# Patient Record
Sex: Female | Born: 1975 | Smoking: Never smoker
Health system: Southern US, Community
[De-identification: ages and names within clinical notes are randomized; demographics above are authoritative.]

## PROBLEM LIST (undated history)

## (undated) DIAGNOSIS — O24419 Gestational diabetes mellitus in pregnancy, unspecified control: Secondary | ICD-10-CM

## (undated) HISTORY — PX: NO PAST SURGERIES: SHX2092

---

## 2015-01-25 NOTE — L&D Delivery Note (Signed)
Delivery Note At 11:08 PM a viable female was delivered via Vaginal, Spontaneous Delivery (Presentation: OA).  APGAR: 9, 9; weight 6 lb 3.3 oz (2815 g).   Placenta status: Intact, Shultz .  3V Cord:  Anesthesia:  None Episiotomy: None Lacerations: None Est. Blood Loss (mL): 50  Mom to postpartum.  Baby to Couplet care / Skin to Skin.  [name] 09/24/2015, 1:00 AM  Patient is a [id number] at 5480w5d who was admitted in latent labor, significant hx of A2GDM.  She progressed with augmentation via AROM/Pit.  I was gloved and present for delivery in its entirety.  Second stage of labor progressed to SVD.  Mild decels during second stage noted.  Complications: none  Lacerations: none  EBL: 50cc  [name], CNM 9:18 AM  09/24/2015

## 2015-08-12 ENCOUNTER — Ambulatory Visit (INDEPENDENT_AMBULATORY_CARE_PROVIDER_SITE_OTHER): Payer: Self-pay | Admitting: Obstetrics & Gynecology

## 2015-08-12 ENCOUNTER — Encounter: Payer: Self-pay | Admitting: Obstetrics & Gynecology

## 2015-08-12 VITALS — BP 117/77 | HR 98 | Ht 62.0 in | Wt 153.0 lb

## 2015-08-12 DIAGNOSIS — O0930 Supervision of pregnancy with insufficient antenatal care, unspecified trimester: Secondary | ICD-10-CM | POA: Insufficient documentation

## 2015-08-12 DIAGNOSIS — O09529 Supervision of elderly multigravida, unspecified trimester: Secondary | ICD-10-CM | POA: Insufficient documentation

## 2015-08-12 DIAGNOSIS — Z603 Acculturation difficulty: Secondary | ICD-10-CM | POA: Insufficient documentation

## 2015-08-12 DIAGNOSIS — Z124 Encounter for screening for malignant neoplasm of cervix: Secondary | ICD-10-CM

## 2015-08-12 DIAGNOSIS — Z641 Problems related to multiparity: Secondary | ICD-10-CM

## 2015-08-12 DIAGNOSIS — O09523 Supervision of elderly multigravida, third trimester: Secondary | ICD-10-CM

## 2015-08-12 DIAGNOSIS — Z3483 Encounter for supervision of other normal pregnancy, third trimester: Secondary | ICD-10-CM

## 2015-08-12 DIAGNOSIS — O0933 Supervision of pregnancy with insufficient antenatal care, third trimester: Secondary | ICD-10-CM

## 2015-08-12 DIAGNOSIS — Z1151 Encounter for screening for human papillomavirus (HPV): Secondary | ICD-10-CM

## 2015-08-12 DIAGNOSIS — Z113 Encounter for screening for infections with a predominantly sexual mode of transmission: Secondary | ICD-10-CM

## 2015-08-12 DIAGNOSIS — Z789 Other specified health status: Secondary | ICD-10-CM | POA: Insufficient documentation

## 2015-08-12 DIAGNOSIS — O0990 Supervision of high risk pregnancy, unspecified, unspecified trimester: Secondary | ICD-10-CM | POA: Insufficient documentation

## 2015-08-12 LAB — POCT URINALYSIS DIP (DEVICE)
Bilirubin Urine: NEGATIVE
Glucose, UA: 500 mg/dL — AB
Ketones, ur: NEGATIVE mg/dL
LEUKOCYTES UA: NEGATIVE
NITRITE: NEGATIVE
PH: 6.5 (ref 5.0–8.0)
PROTEIN: NEGATIVE mg/dL
Specific Gravity, Urine: 1.015 (ref 1.005–1.030)
UROBILINOGEN UA: 0.2 mg/dL (ref 0.0–1.0)

## 2015-08-12 NOTE — Progress Notes (Signed)
Video interpreter [phone]#[id number]

## 2015-08-12 NOTE — Progress Notes (Signed)
   Subjective:    Carolyn Huffman is a [id number] 8433w5d being seen today for her first obstetrical visit.  Her obstetrical history is significant for advanced maternal age and language barrier, grand multipara. Patient does intend to breast feed. Pregnancy history fully reviewed.  Patient reports no complaints.  Filed Vitals:   08/12/15 1506 08/12/15 1506  BP: 117/77   Pulse: 98   Height:  5\' 2"  (1.575 m)  Weight: 153 lb (69.4 kg)     HISTORY: OB History  Gravida Para Term Preterm AB SAB TAB Ectopic Multiple Living  8 5 5  0 2 2 0 0 0 5    # Outcome Date GA Lbr Len/2nd Weight Sex Delivery Anes PTL Lv  8 Current           7 Term 06/04/10 7066w0d  8 lb 2.5 oz (3.7 kg) M Jarrett AblesVag-Spont  N Y  6 Term 07/18/08 642w0d  7 lb 0.9 oz (3.2 kg) M Jarrett AblesVag-Spont  N Y  5 Term 08/07/04 9242w0d  7 lb 0.9 oz (3.2 kg) M Jarrett AblesVag-Spont  N Y  4 Term 06/01/03 3042w0d  6 lb 6.3 oz (2.9 kg) M Vag-Spont  N Y  3 Term 08/30/01 6766w0d  6 lb 2.8 oz (2.8 kg) F Vag-Spont  N Y  2 SAB           1 SAB              History reviewed. No pertinent past medical history. Past Surgical History  Procedure Laterality Date  . No past surgeries     Family History  Problem Relation Age of Onset  . Hypertension Mother   . Hypertension Father      Exam    Uterus:     Pelvic Exam:    Perineum: No Hemorrhoids   Vulva: normal   Vagina:  normal mucosa   pH:    Cervix: anteverted   Adnexa: normal adnexa   Bony Pelvis: android  System: Breast:  normal appearance, no masses or tenderness   Skin: normal coloration and turgor, no rashes    Neurologic: oriented   Extremities: normal strength, tone, and muscle mass   HEENT PERRLA   Mouth/Teeth mucous membranes moist, pharynx normal without lesions   Neck supple   Cardiovascular: regular rate and rhythm   Respiratory:  appears well, vitals normal, no respiratory distress, acyanotic, normal RR, ear and throat exam is normal, neck free of mass or lymphadenopathy, chest clear, no  wheezing, crepitations, rhonchi, normal symmetric air entry   Abdomen: soft, non-tender; bowel sounds normal; no masses,  no organomegaly   Urinary: urethral meatus normal      Assessment:    Pregnancy: [id number] Patient Active Problem List   Diagnosis Date Noted  . AMA (advanced maternal age) multigravida 35+ 08/12/2015  . Language barrier 08/12/2015  . Late prenatal care 08/12/2015  . Grand multipara 08/12/2015  . Supervision of other normal pregnancy, antepartum 08/12/2015        Plan:     Initial labs drawn. Prenatal vitamins. Problem list reviewed and updated.   Ultrasound discussed; fetal survey: ordered.  Follow up in 2 weeks.  Allie BossierMyra C Majesta Leichter 08/12/2015

## 2015-08-13 LAB — PAIN MGMT, PROFILE 6 CONF W/O MM, U
6 Acetylmorphine: NEGATIVE ng/mL (ref <10)
AMPHETAMINES: NEGATIVE ng/mL (ref <500)
Alcohol Metabolites: NEGATIVE ng/mL (ref <500)
BARBITURATES: NEGATIVE ng/mL (ref <300)
BENZODIAZEPINES: NEGATIVE ng/mL (ref <100)
COCAINE METABOLITE: NEGATIVE ng/mL (ref <150)
CREATININE: 91.2 mg/dL (ref >=20.0)
MARIJUANA METABOLITE: NEGATIVE ng/mL (ref <20)
Methadone Metabolite: NEGATIVE ng/mL (ref <100)
OPIATES: NEGATIVE ng/mL (ref <100)
OXIDANT: NEGATIVE ug/mL (ref <200)
OXYCODONE: NEGATIVE ng/mL (ref <100)
PLEASE NOTE: 0
Phencyclidine: NEGATIVE ng/mL (ref <25)
pH: 6.9 (ref 4.5–9.0)

## 2015-08-13 LAB — GLUCOSE TOLERANCE, 1 HOUR (50G) W/O FASTING: Glucose, 1 Hr, gestational: 266 mg/dL — ABNORMAL HIGH (ref <140)

## 2015-08-13 LAB — PRENATAL PROFILE (SOLSTAS)
ANTIBODY SCREEN: NEGATIVE
BASOS PCT: 0 %
Basophils Absolute: 0 cells/uL (ref 0–200)
Eosinophils Absolute: 63 cells/uL (ref 15–500)
Eosinophils Relative: 1 %
HEMATOCRIT: 29 % — AB (ref 35.0–45.0)
HIV 1&2 Ab, 4th Generation: NONREACTIVE
Hemoglobin: 9.8 g/dL — ABNORMAL LOW (ref 11.7–15.5)
Hepatitis B Surface Ag: NEGATIVE
LYMPHS PCT: 25 %
Lymphs Abs: 1575 cells/uL (ref 850–3900)
MCH: 30.6 pg (ref 27.0–33.0)
MCHC: 33.8 g/dL (ref 32.0–36.0)
MCV: 90.6 fL (ref 80.0–100.0)
MONOS PCT: 9 %
MPV: 11.2 fL (ref 7.5–12.5)
Monocytes Absolute: 567 cells/uL (ref 200–950)
Neutro Abs: 4095 cells/uL (ref 1500–7800)
Neutrophils Relative %: 65 %
PLATELETS: 191 10*3/uL (ref 140–400)
RBC: 3.2 MIL/uL — AB (ref 3.80–5.10)
RDW: 13.5 % (ref 11.0–15.0)
RH TYPE: POSITIVE
Rubella: 1.68 Index — ABNORMAL HIGH (ref <0.90)
WBC: 6.3 10*3/uL (ref 3.8–10.8)

## 2015-08-13 LAB — GC/CHLAMYDIA PROBE AMP (~~LOC~~) NOT AT ARMC
Chlamydia: NEGATIVE
NEISSERIA GONORRHEA: NEGATIVE

## 2015-08-14 ENCOUNTER — Encounter (HOSPITAL_COMMUNITY): Payer: Self-pay | Admitting: Obstetrics & Gynecology

## 2015-08-14 LAB — HEMOGLOBINOPATHY EVALUATION
HCT: 29 % — ABNORMAL LOW (ref 35.0–45.0)
HGB A2 QUANT: 2.3 % (ref 1.8–3.5)
HGB A: 96.7 % (ref >96.0)
Hemoglobin: 9.8 g/dL — ABNORMAL LOW (ref 11.7–15.5)
MCH: 30.6 pg (ref 27.0–33.0)
MCV: 90.6 fL (ref 80.0–100.0)
RBC: 3.2 MIL/uL — ABNORMAL LOW (ref 3.80–5.10)
RDW: 13.5 % (ref 11.0–15.0)

## 2015-08-14 LAB — CULTURE, OB URINE
Colony Count: NO GROWTH
Organism ID, Bacteria: NO GROWTH

## 2015-08-14 LAB — CYTOLOGY - PAP

## 2015-08-21 ENCOUNTER — Encounter (HOSPITAL_COMMUNITY): Payer: Self-pay

## 2015-08-21 ENCOUNTER — Ambulatory Visit (HOSPITAL_COMMUNITY)
Admission: RE | Admit: 2015-08-21 | Discharge: 2015-08-21 | Disposition: A | Discharge status: Home / Self Care | Payer: Self-pay | Source: Ambulatory Visit | Attending: Obstetrics & Gynecology | Admitting: Obstetrics & Gynecology

## 2015-08-21 ENCOUNTER — Other Ambulatory Visit: Payer: Self-pay | Admitting: Obstetrics & Gynecology

## 2015-08-21 DIAGNOSIS — Z641 Problems related to multiparity: Secondary | ICD-10-CM

## 2015-08-21 DIAGNOSIS — Z3689 Encounter for other specified antenatal screening: Secondary | ICD-10-CM

## 2015-08-21 DIAGNOSIS — O0933 Supervision of pregnancy with insufficient antenatal care, third trimester: Secondary | ICD-10-CM

## 2015-08-21 DIAGNOSIS — Z3A34 34 weeks gestation of pregnancy: Secondary | ICD-10-CM | POA: Insufficient documentation

## 2015-08-21 DIAGNOSIS — Z3483 Encounter for supervision of other normal pregnancy, third trimester: Secondary | ICD-10-CM

## 2015-08-21 DIAGNOSIS — O09523 Supervision of elderly multigravida, third trimester: Secondary | ICD-10-CM

## 2015-08-21 DIAGNOSIS — O0943 Supervision of pregnancy with grand multiparity, third trimester: Secondary | ICD-10-CM

## 2015-08-21 DIAGNOSIS — Z789 Other specified health status: Secondary | ICD-10-CM

## 2015-08-21 DIAGNOSIS — Z36 Encounter for antenatal screening of mother: Secondary | ICD-10-CM | POA: Insufficient documentation

## 2015-08-21 IMAGING — US US MFM OB DETAIL+14 WK
1 series · 14 of 28 positions shown · non-contrast
Comparison: none

[Series 1: us mfm ob detail+14 wk · 82 acquisitions, 14 frames shown]
[im 4/82]
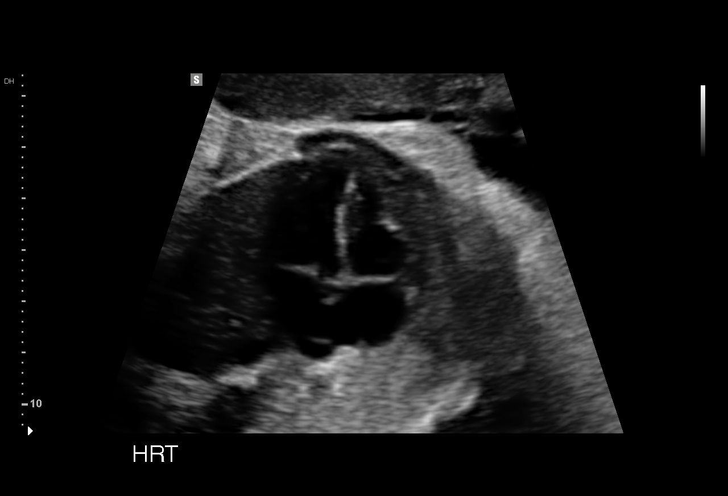
[im 10/82]
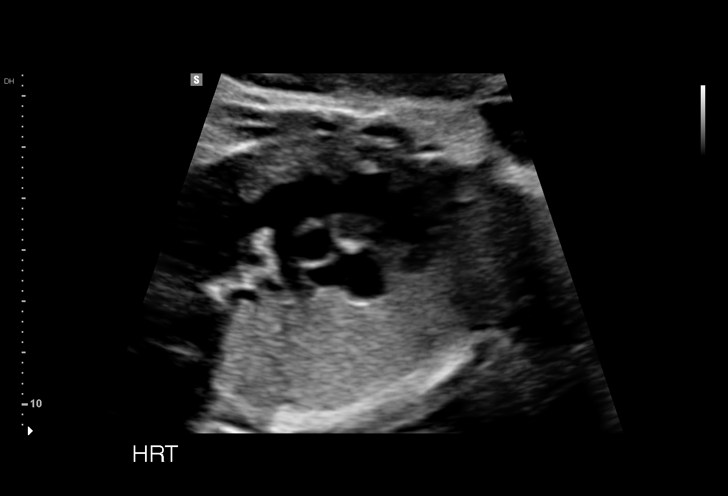
[im 16/82]
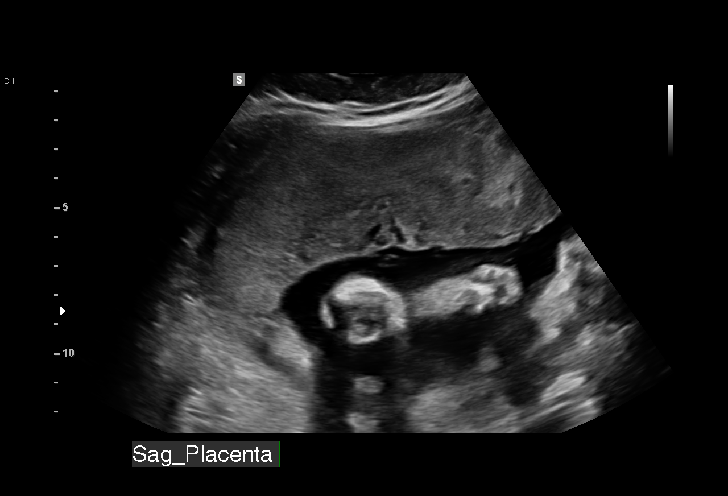
[im 22/82]
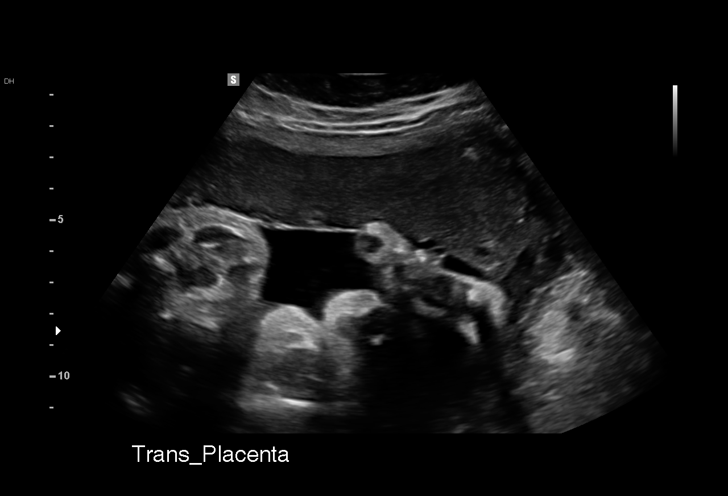
[im 28/82]
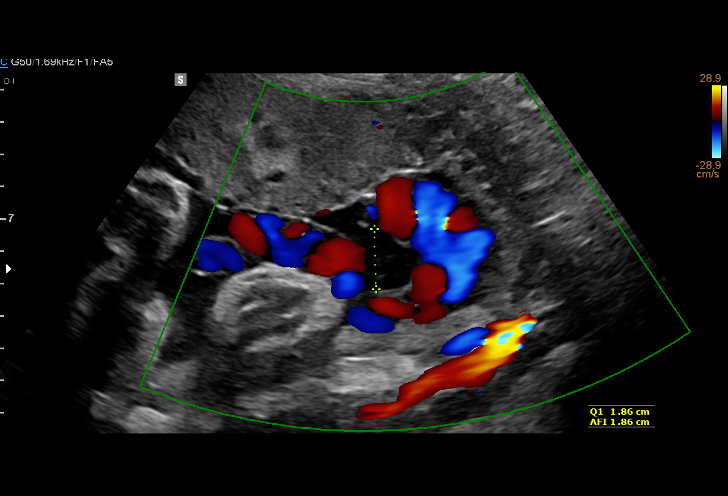
[im 34/82]
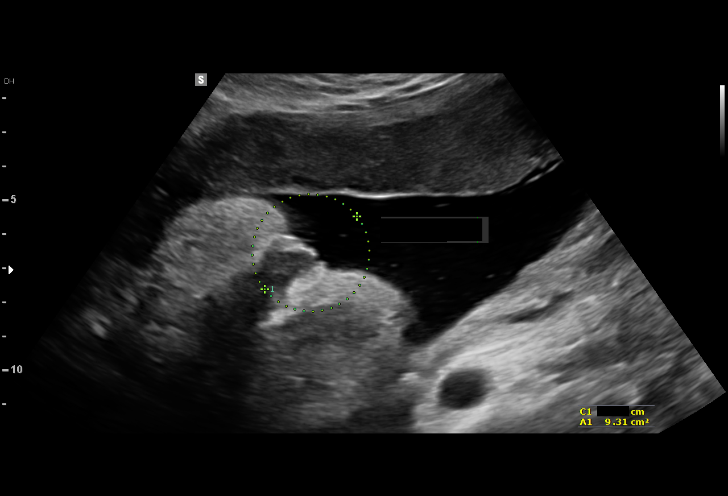
[im 40/82]
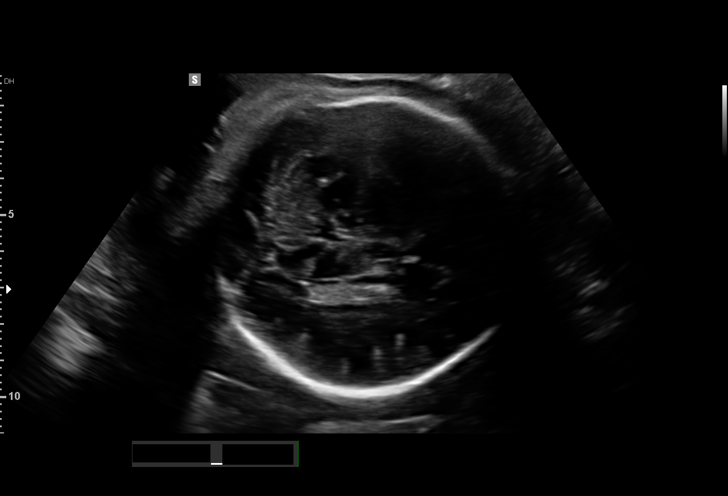
[im 46/82]
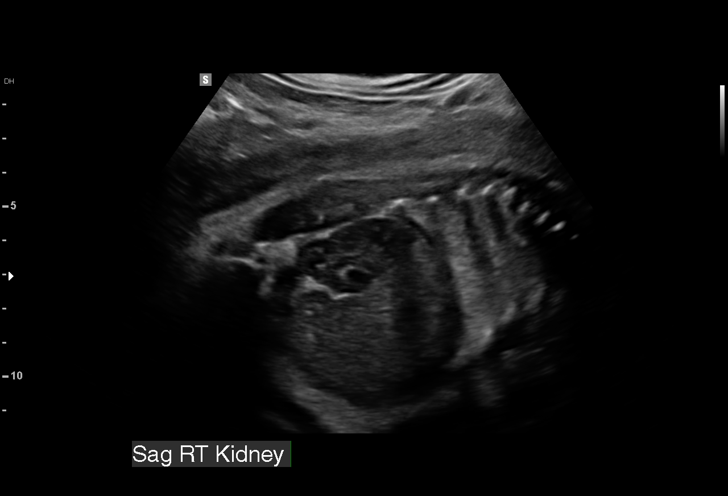
[im 52/82]
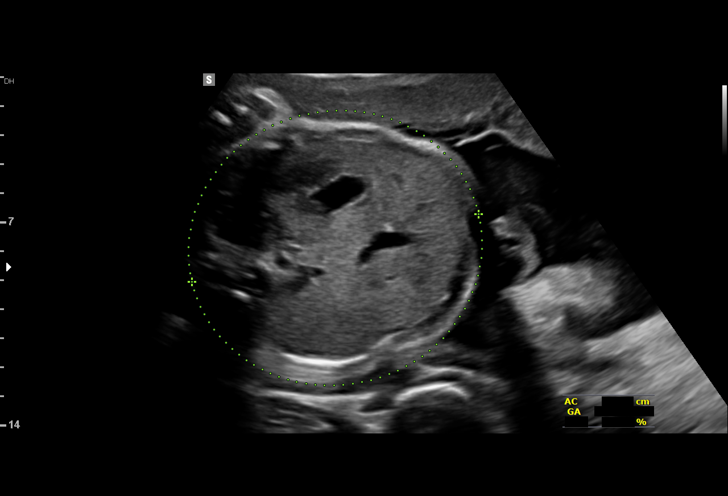
[im 58/82]
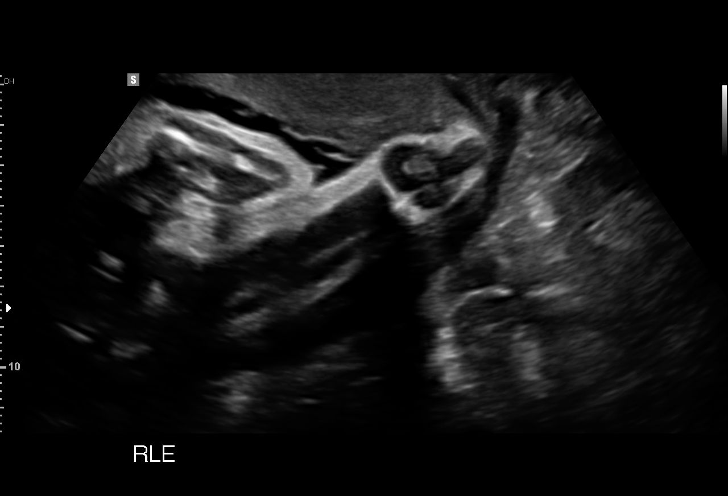
[im 64/82]
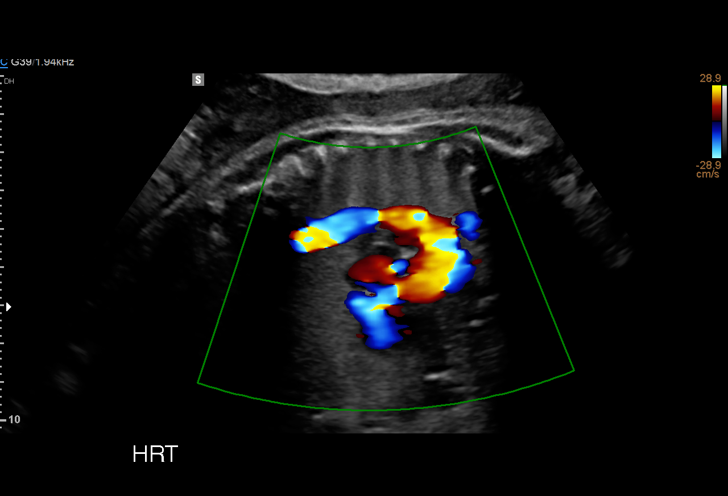
[im 70/82]
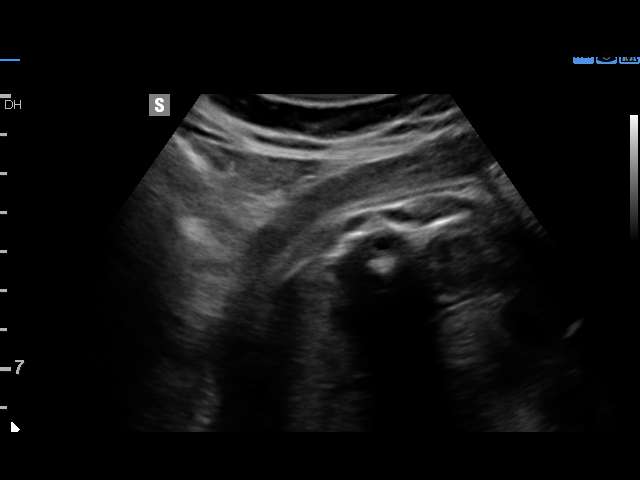
[im 76/82]
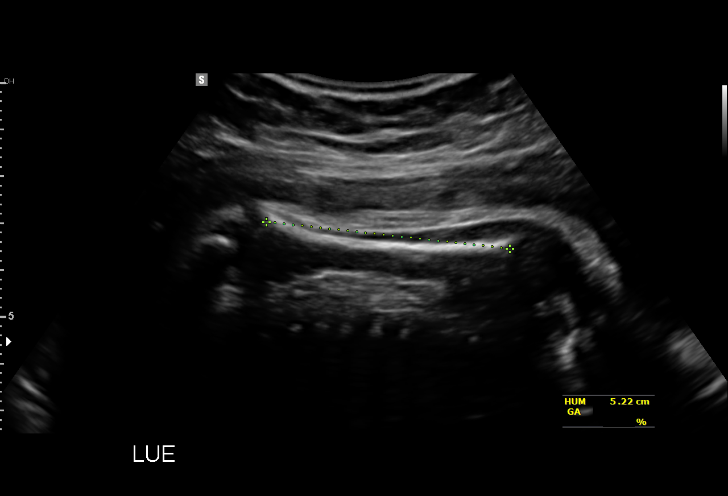
[im 82/82]
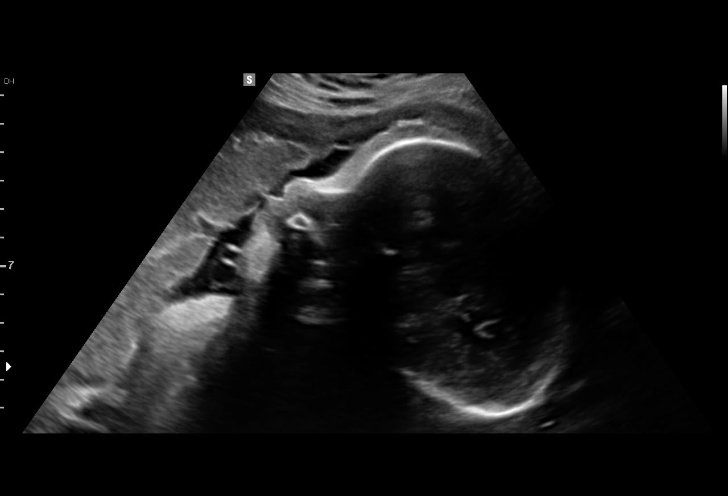

[14 of 28 positions shown; findings below may reference images not displayed]

[facility]
[REDACTED]-
Faculty Physician

1  KAAY                [PHONE_NUMBER]      [PHONE_NUMBER]     [PHONE_NUMBER]
Indications

34 weeks gestation of pregnancy
Detailed fetal anatomic survey                 Z36
Advanced maternal age multigravida 35+,        [C8]
third trimester
Grand multiparity, antepartum                  [C8]
Late to prenatal care, third trimester         [C8]
OB History

Gravidity:    8         Term:   5        Prem:   0        SAB:   2
TOP:          0       Ectopic:  0        Living: 5
Fetal Evaluation

Num Of Fetuses:     1
Fetal Heart         122
Rate(bpm):
Cardiac Activity:   Observed
Presentation:       Cephalic
Placenta:           Anterior, above cervical os
P. Cord Insertion:  Visualized, central

Amniotic Fluid
AFI FV:      Subjectively within normal limits

AFI Sum(cm)     %Tile       Largest Pocket(cm)
11.55           30
RUQ(cm)       RLQ(cm)       LUQ(cm)        LLQ(cm)
1.86
Biometry

BPD:        79  mm     G. Age:  31w 5d          3  %    CI:        72.08   %   70 - 86
FL/HC:      20.7   %   19.4 -
HC:      296.1  mm     G. Age:  32w 5d        < 3  %    HC/AC:      0.96       0.96 -
AC:       310   mm     G. Age:  34w 6d         79  %    FL/BPD:     77.7   %   71 - 87
FL:       61.4  mm     G. Age:  31w 6d          4  %    FL/AC:      19.8   %   20 - 24
HUM:      52.2  mm     G. Age:  30w 3d        < 5  %
CER:      43.6  mm     G. Age:  37w 4d         91  %

CM:        7.6  mm

Est. FW:    [C8]  gm    4 lb 14 oz      50  %
Gestational Age

LMP:           34w 0d       Date:   [DATE]                 EDD:   [DATE]
U/S Today:     32w 6d                                        EDD:   [DATE]
Best:          34w 0d    Det. By:   LMP  ([DATE])          EDD:   [DATE]
Anatomy

Cranium:               Appears normal         Aortic Arch:            Appears normal
Cavum:                 Appears normal         Ductal Arch:            Appears normal
Ventricles:            Appears normal         Diaphragm:              Appears normal
Choroid Plexus:        Appears normal         Stomach:                Appears normal, left
sided
Cerebellum:            Appears normal         Abdomen:                Appears normal
Posterior Fossa:       Appears normal         Abdominal Wall:         Not well visualized
Nuchal Fold:           Not applicable (>20    Cord Vessels:           Appears normal (3
wks GA)                                        vessel cord)
Face:                  Appears normal         Kidneys:                Appear normal
(orbits and profile)
Lips:                  Appears normal         Bladder:                Appears normal
Thoracic:              Appears normal         Spine:                  Appears normal
Heart:                 Appears normal         Upper Extremities:      Appears normal
(4CH, axis, and
situs)
RVOT:                  Appears normal         Lower Extremities:      Appears normal
LVOT:                  Appears normal

Other:  Fetus appears to be a female. Heels visualized. Nasal bone
visualized. Technically difficult due to advanced GA and fetal position.
Cervix Uterus Adnexa

Cervix
Not visualized (advanced GA >[C8])

Left Ovary
Within normal limits.

Right Ovary
Within normal limits.

Adnexa:       No abnormality visualized.
Impression

Single IUP at 34w 0d
Advanced maternal age > 40
Normal fetal anatomic survey, but somewhat limited due to
advanced gestational age
The estimated fetal weight is at the 50th %tile
Anterior placenta without previa
Normal amniotic fluid volume
Recommendations

Recommend antenatal testing beginning no later than 36
weeks gestation (AMA > 40)
Consider ultrasound for growth in 4 weeks if undelived
Recommend delivery by EDD

## 2015-08-25 ENCOUNTER — Telehealth: Payer: Self-pay | Admitting: *Deleted

## 2015-08-25 DIAGNOSIS — O24419 Gestational diabetes mellitus in pregnancy, unspecified control: Secondary | ICD-10-CM

## 2015-08-25 NOTE — Telephone Encounter (Signed)
Per Dr. Marice Potter, pt has Gestational Diabetes and needs appt w/Diabetes Educator. I scheduled appt on 8/3 @ 1530 w/ Maggie @ MFM dept. Pt was then called w/[employer] interpreter # [phone] and I left a message stating that she has appt in our office tomorrow @ 0940. This appt is very important as we have new test results to discuss with her. Pt will be advised of Diabetes Ed appt tomorrow during her prenatal visit.

## 2015-08-26 ENCOUNTER — Ambulatory Visit (INDEPENDENT_AMBULATORY_CARE_PROVIDER_SITE_OTHER): Payer: Self-pay | Admitting: Advanced Practice Midwife

## 2015-08-26 ENCOUNTER — Encounter: Payer: Self-pay | Admitting: Advanced Practice Midwife

## 2015-08-26 DIAGNOSIS — Z3483 Encounter for supervision of other normal pregnancy, third trimester: Secondary | ICD-10-CM

## 2015-08-26 DIAGNOSIS — O09529 Supervision of elderly multigravida, unspecified trimester: Secondary | ICD-10-CM | POA: Insufficient documentation

## 2015-08-26 DIAGNOSIS — O09523 Supervision of elderly multigravida, third trimester: Secondary | ICD-10-CM

## 2015-08-26 LAB — POCT URINALYSIS DIP (DEVICE)
BILIRUBIN URINE: NEGATIVE
Glucose, UA: >=1000 mg/dL — AB
Ketones, ur: NEGATIVE mg/dL
Leukocytes, UA: NEGATIVE
Nitrite: NEGATIVE
PH: 6 (ref 5.0–8.0)
PROTEIN: NEGATIVE mg/dL
SPECIFIC GRAVITY, URINE: 1.01 (ref 1.005–1.030)
UROBILINOGEN UA: 0.2 mg/dL (ref 0.0–1.0)

## 2015-08-26 NOTE — Patient Instructions (Signed)
Gestational Diabetes Mellitus  Gestational diabetes mellitus, often simply referred to as gestational diabetes, is a type of diabetes that some women develop during pregnancy. In gestational diabetes, the pancreas does not make enough insulin (a hormone), the cells are less responsive to the insulin that is made (insulin resistance), or both. Normally, insulin moves sugars from food into the tissue cells. The tissue cells use the sugars for energy. The lack of insulin or the lack of normal response to insulin causes excess sugars to build up in the blood instead of going into the tissue cells. As a result, high blood sugar (hyperglycemia) develops. The effect of high sugar (glucose) levels can cause many problems.   RISK FACTORS  You have an increased chance of developing gestational diabetes if you have a family history of diabetes and also have one or more of the following risk factors:  · A body mass index over 30 (obesity).  · A previous pregnancy with gestational diabetes.  · An older age at the time of pregnancy.  If blood glucose levels are kept in the normal range during pregnancy, women can have a healthy pregnancy. If your blood glucose levels are not well controlled, there may be risks to you, your unborn baby (fetus), your labor and delivery, or your newborn baby.   SYMPTOMS   If symptoms are experienced, they are much like symptoms you would normally expect during pregnancy. The symptoms of gestational diabetes include:   · Increased thirst (polydipsia).  · Increased urination (polyuria).  · Increased urination during the night (nocturia).  · Weight loss. This weight loss may be rapid.  · Frequent, recurring infections.  · Tiredness (fatigue).  · Weakness.  · Vision changes, such as blurred vision.  · Fruity smell to your breath.  · Abdominal pain.  DIAGNOSIS  Diabetes is diagnosed when blood glucose levels are increased. Your blood glucose level may be checked by one or more of the following blood  tests:  · A fasting blood glucose test. You will not be allowed to eat for at least 8 hours before a blood sample is taken.  · A random blood glucose test. Your blood glucose is checked at any time of the day regardless of when you ate.  · An oral glucose tolerance test (OGTT). Your blood glucose is measured after you have not eaten (fasted) for 1-3 hours and then after you drink a glucose-containing beverage. Since the hormones that cause insulin resistance are highest at about 24-28 weeks of a pregnancy, an OGTT is usually performed during that time. If you have risk factors, you may be screened for undiagnosed type 2 diabetes at your first prenatal visit.  TREATMENT   Gestational diabetes should be managed first with diet and exercise. Medicines may be added only if they are needed.  · You will need to take diabetes medicine or insulin daily to keep blood glucose levels in the desired range.  · You will need to match insulin dosing with exercise and healthy food choices.  If you have gestational diabetes, your treatment goal is to maintain the following blood glucose levels:  · Before meals (preprandial): at or below 95 mg/dL.  · After meals (postprandial):    One hour after a meal: at or below 140 mg/dL.    Two hours after a meal: at or below 120 mg/dL.  If you have pre-existing type 1 or type 2 diabetes, your treatment goal is to maintain the following blood glucose levels:  · Before   meals, at bedtime, and overnight: 60-99 mg/dL.  · After meals: peak of 100-129 mg/dL.  HOME CARE INSTRUCTIONS   · Have your hemoglobin A1c level checked twice a year.  · Perform daily blood glucose monitoring as directed by your health care provider. It is common to perform frequent blood glucose monitoring.  · Monitor urine ketones when you are ill and as directed by your health care provider.  · Take your diabetes medicine and insulin as directed by your health care provider to maintain your blood glucose level in the desired  range.  ¨ Never run out of diabetes medicine or insulin. It is needed every day.  ¨ Adjust insulin based on your intake of carbohydrates. Carbohydrates can raise blood glucose levels but need to be included in your diet. Carbohydrates provide vitamins, minerals, and fiber which are an essential part of a healthy diet. Carbohydrates are found in fruits, vegetables, whole grains, dairy products, legumes, and foods containing added sugars.  · Eat healthy foods. Alternate 3 meals with 3 snacks.  · Maintain a healthy weight gain. The usual total expected weight gain varies according to your prepregnancy body mass index (BMI).  · Carry a medical alert card or wear your medical alert jewelry.  · Carry a 15-gram carbohydrate snack with you at all times to treat low blood glucose (hypoglycemia). Some examples of 15-gram carbohydrate snacks include:  ¨ Glucose tablets, 3 or 4.  ¨ Glucose gel, 15-gram tube.  ¨ Raisins, 2 tablespoons (24 g).  ¨ Jelly beans, 6.  ¨ Animal crackers, 8.  ¨ Fruit juice, regular soda, or low-fat milk, 4 ounces (120 mL).  ¨ Gummy treats, 9.  · Recognize hypoglycemia. Hypoglycemia during pregnancy occurs with blood glucose levels of 60 mg/dL and below. The risk for hypoglycemia increases when fasting or skipping meals, during or after intense exercise, and during sleep. Hypoglycemia symptoms can include:  ¨ Tremors or shakes.  ¨ Decreased ability to concentrate.  ¨ Sweating.  ¨ Increased heart rate.  ¨ Headache.  ¨ Dry mouth.  ¨ Hunger.  ¨ Irritability.  ¨ Anxiety.  ¨ Restless sleep.  ¨ Altered speech or coordination.  ¨ Confusion.  · Treat hypoglycemia promptly. If you are alert and able to safely swallow, follow the 15:15 rule:  ¨ Take 15-20 grams of rapid-acting glucose or carbohydrate. Rapid-acting options include glucose gel, glucose tablets, or 4 ounces (120 mL) of fruit juice, regular soda, or low-fat milk.  ¨ Check your blood glucose level 15 minutes after taking the glucose.  ¨ Take 15-20  grams more of glucose if the repeat blood glucose level is still 70 mg/dL or below.  ¨ Eat a meal or snack within 1 hour once blood glucose levels return to normal.  · Be alert to polyuria (excess urination) and polydipsia (excess thirst) which are early signs of hyperglycemia. An early awareness of hyperglycemia allows for prompt treatment. Treat hyperglycemia as directed by your health care provider.  · Engage in at least 30 minutes of physical activity a day or as directed by your health care provider. Ten minutes of physical activity timed 30 minutes after each meal is encouraged to control postprandial blood glucose levels.  · Adjust your insulin dosing and food intake as needed if you start a new exercise or sport.  · Follow your sick-day plan at any time you are unable to eat or drink as usual.  · Avoid tobacco and alcohol use.  · Keep all follow-up visits as directed   by your health care provider.  · Follow the advice of your health care provider regarding your prenatal and post-delivery (postpartum) appointments, meal planning, exercise, medicines, vitamins, blood tests, other medical tests, and physical activities.  · Perform daily skin and foot care. Examine your skin and feet daily for cuts, bruises, redness, nail problems, bleeding, blisters, or sores.  · Brush your teeth and gums at least twice a day and floss at least once a day. Follow up with your dentist regularly.  · Schedule an eye exam during the first trimester of your pregnancy or as directed by your health care provider.  · Share your diabetes management plan with your workplace or school.  · Stay up-to-date with immunizations.  · Learn to manage stress.  · Obtain ongoing diabetes education and support as needed.  · Learn about and consider breastfeeding your baby.  · You should have your blood sugar level checked 6-12 weeks after delivery. This is done with an oral glucose tolerance test (OGTT).  SEEK MEDICAL CARE IF:   · You are unable to  eat food or drink fluids for more than 6 hours.  · You have nausea and vomiting for more than 6 hours.  · You have a blood glucose level of 200 mg/dL and you have ketones in your urine.  · There is a change in mental status.  · You develop vision problems.  · You have a persistent headache.  · You have upper abdominal pain or discomfort.  · You develop an additional serious illness.  · You have diarrhea for more than 6 hours.  · You have been sick or have had a fever for a couple of days and are not getting better.  SEEK IMMEDIATE MEDICAL CARE IF:   · You have difficulty breathing.  · You no longer feel the baby moving.  · You are bleeding or have discharge from your vagina.  · You start having premature contractions or labor.  MAKE SURE YOU:  · Understand these instructions.  · Will watch your condition.  · Will get help right away if you are not doing well or get worse.     This information is not intended to replace advice given to you by your health care provider. Make sure you discuss any questions you have with your health care provider.     Document Released: 04/18/2000 Document Revised: 01/31/2014 Document Reviewed: 08/09/2011  Elsevier Interactive Patient Education ©2016 [employer]

## 2015-08-26 NOTE — Progress Notes (Signed)
Carolyn Huffman [id number]

## 2015-08-26 NOTE — Progress Notes (Addendum)
Subjective:  Talea Toccarra Kestel is a 40 y.o. [id number] at [redacted]w[redacted]d being seen today for ongoing prenatal care.  Interpretor used.  She is currently monitored for the following issues for this low-risk pregnancy and has Language barrier; Late prenatal care; Grand multipara; Supervision of other normal pregnancy, antepartum; and Multigravida of advanced maternal age on her problem list.  Patient reports no complaints.  Contractions: Not present. Vag. Bleeding: None.  Movement: Present. Denies leaking of fluid.   The following portions of the patient's history were reviewed and updated as appropriate: allergies, current medications, past family history, past medical history, past social history, past surgical history and problem list. Problem list updated.  Objective:   Vitals:   08/26/15 1000  BP: 119/68  Pulse: 71  Temp: 98.4 F (36.9 C)  Weight: 155 lb 11.2 oz (70.6 kg)    Fetal Status: Fetal Heart Rate (bpm): 130 Fundal Height: 35 cm Movement: Present     General:  Alert, oriented and cooperative. Patient is in no acute distress.  Skin: Skin is warm and dry. No rash noted.   Cardiovascular: Normal heart rate noted  Respiratory: Normal respiratory effort, no problems with respiration noted  Abdomen: Soft, gravid, appropriate for gestational age. Pain/Pressure: Present     Pelvic:  Cervical exam deferred        Extremities: Normal range of motion.  Edema: None  Mental Status: Normal mood and affect. Normal behavior. Normal judgment and thought content.   Urinalysis:      Assessment and Plan:  Pregnancy: [id number] at [redacted]w[redacted]d  1. Supervision of other normal pregnancy, antepartum, third trimester       2. Multigravida of advanced maternal age, third trimester      Begin testing at 36 wks per MFM  Preterm labor symptoms and general obstetric precautions including but not limited to vaginal bleeding, contractions, leaking of fluid and fetal movement were reviewed in detail with the  patient. Please refer to After Visit Summary for other counseling recommendations.  Return in about 1 week (around 09/02/2015) for [facility].   Aviva Signs, CNM

## 2015-08-27 ENCOUNTER — Ambulatory Visit (HOSPITAL_COMMUNITY)
Admission: RE | Admit: 2015-08-27 | Discharge: 2015-08-27 | Disposition: A | Discharge status: Home / Self Care | Payer: Self-pay | Source: Ambulatory Visit | Attending: Obstetrics & Gynecology | Admitting: Obstetrics & Gynecology

## 2015-08-27 ENCOUNTER — Encounter: Payer: Medicaid Other | Attending: Obstetrics & Gynecology | Admitting: *Deleted

## 2015-08-27 DIAGNOSIS — O24419 Gestational diabetes mellitus in pregnancy, unspecified control: Secondary | ICD-10-CM | POA: Insufficient documentation

## 2015-08-27 DIAGNOSIS — Z713 Dietary counseling and surveillance: Secondary | ICD-10-CM | POA: Diagnosis present

## 2015-08-27 NOTE — Progress Notes (Signed)
  Patient was seen on 08/27/2015 for Gestational Diabetes. The following learning objectives were met by the patient during this course: Patient is from [place], arriving here 3 months ago,and speaks [place]. Interpretor her to assist with the visit.   States the definition of Gestational Diabetes  States why dietary management is important in controlling blood glucose  Describes the effects each nutrient has on blood glucose levels  Demonstrates ability to create a balanced meal plan  Demonstrates carbohydrate counting   States when to check blood glucose levels  Demonstrates proper blood glucose monitoring techniques  States the effect of stress and exercise on blood glucose levels  States the importance of limiting caffeine and abstaining from alcohol and smoking  Blood glucose monitor information given: to purchase at [facility] as patient has no insurance Demonstrated technique of loading lancing device and use of strip in meter. Provided BG Log Book to record BG's  Patient instructed to monitor glucose levels: FBS: 60 - <90 2 hour: <120  *Patient received handouts: in [place], she states she has a family member at home that can speak [place].  Nutrition Diabetes and Pregnancy  Carbohydrate Counting List  Meter information for [employer] Meter and supplies  Patient will be seen for follow-up as needed.

## 2015-09-07 ENCOUNTER — Encounter: Payer: Self-pay | Admitting: Family Medicine

## 2015-09-07 ENCOUNTER — Ambulatory Visit (INDEPENDENT_AMBULATORY_CARE_PROVIDER_SITE_OTHER): Payer: Medicaid Other | Admitting: Obstetrics and Gynecology

## 2015-09-07 VITALS — BP 118/62 | HR 83 | Wt 153.9 lb

## 2015-09-07 DIAGNOSIS — O24419 Gestational diabetes mellitus in pregnancy, unspecified control: Secondary | ICD-10-CM

## 2015-09-07 DIAGNOSIS — O09523 Supervision of elderly multigravida, third trimester: Secondary | ICD-10-CM

## 2015-09-07 DIAGNOSIS — Z3483 Encounter for supervision of other normal pregnancy, third trimester: Secondary | ICD-10-CM

## 2015-09-07 LAB — POCT URINALYSIS DIP (DEVICE)
BILIRUBIN URINE: NEGATIVE
GLUCOSE, UA: 100 mg/dL — AB
KETONES UR: NEGATIVE mg/dL
Leukocytes, UA: NEGATIVE
Nitrite: NEGATIVE
PH: 6 (ref 5.0–8.0)
Protein, ur: NEGATIVE mg/dL
Specific Gravity, Urine: 1.01 (ref 1.005–1.030)
Urobilinogen, UA: 0.2 mg/dL (ref 0.0–1.0)

## 2015-09-07 LAB — OB RESULTS CONSOLE GBS: GBS: POSITIVE

## 2015-09-07 MED ORDER — GLYBURIDE 2.5 MG PO TABS: 2.5000 mg | ORAL_TABLET | Freq: Two times a day (BID) | ORAL | 3 refills | Status: DC

## 2015-09-07 MED ORDER — PRENATAL VITAMINS PLUS 27-1 MG PO TABS: 1.0000 | ORAL_TABLET | Freq: Every day | ORAL | 6 refills | Status: AC

## 2015-09-07 NOTE — Progress Notes (Signed)
Subjective:  Carolyn Huffman is a 40 y.o. [id number] at 4029w3d being seen today for ongoing prenatal care.  She is currently monitored for the following issues for this high-risk pregnancy and has Language barrier; Late prenatal care; Grand multipara; Supervision of other normal pregnancy, antepartum; Multigravida of advanced maternal age; and Gestational diabetes mellitus (GDM) affecting pregnancy on her problem list.  Patient reports no complaints.  Contractions: Irregular. Vag. Bleeding: None.  Movement: Present. Denies leaking of fluid.   The following portions of the patient's history were reviewed and updated as appropriate: allergies, current medications, past family history, past medical history, past social history, past surgical history and problem list. Problem list updated.  Objective:   Vitals:   09/07/15 1013  BP: 118/62  Pulse: 83  Weight: 153 lb 14.4 oz (69.8 kg)    Fetal Status: Fetal Heart Rate (bpm): NST   Movement: Present  Presentation: Vertex  General:  Alert, oriented and cooperative. Patient is in no acute distress.  Skin: Skin is warm and dry. No rash noted.   Cardiovascular: Normal heart rate noted  Respiratory: Normal respiratory effort, no problems with respiration noted  Abdomen: Soft, gravid, appropriate for gestational age. Pain/Pressure: Present     Pelvic:  Cervical exam performed Dilation: 2 Effacement (%): Thick Station: Ballotable  Extremities: Normal range of motion.  Edema: None  Mental Status: Normal mood and affect. Normal behavior. Normal judgment and thought content.   Urinalysis:      Assessment and Plan:  Pregnancy: A5W0981G9P5025 at 2429w3d  1. Gestational diabetes mellitus (GDM) affecting pregnancy CBGs reviewed. Fasting all within range but all pp elevated Will start glyburide BID - Fetal nonstress test - Prenatal Vit-Fe Fumarate-FA (PRENATAL VITAMINS PLUS) 27-1 MG TABS; Take 1 tablet by mouth daily.  Dispense: 30 tablet; Refill: 6 - Culture,  beta strep (group b only)  2. Supervision of other normal pregnancy, antepartum, third trimester Cultures today - Prenatal Vit-Fe Fumarate-FA (PRENATAL VITAMINS PLUS) 27-1 MG TABS; Take 1 tablet by mouth daily.  Dispense: 30 tablet; Refill: 6 - Culture, beta strep (group b only)  3. Advanced maternal age in multigravida, third trimester NST reviewed and reactive - Fetal nonstress test - Culture, beta strep (group b only)  Preterm labor symptoms and general obstetric precautions including but not limited to vaginal bleeding, contractions, leaking of fluid and fetal movement were reviewed in detail with the patient. Please refer to After Visit Summary for other counseling recommendations.  Return in about 3 days (around 09/10/2015) for nst/afi, then needs 2xweek nst.   Catalina AntiguaPeggy Maddalena Linarez, MD

## 2015-09-07 NOTE — Progress Notes (Signed)
Used [employer] interpreter [phone]. Patient states ran out of PNV. Refill ordered.

## 2015-09-08 LAB — CULTURE, BETA STREP (GROUP B ONLY)

## 2015-09-10 ENCOUNTER — Ambulatory Visit (HOSPITAL_COMMUNITY)
Admission: RE | Admit: 2015-09-10 | Discharge: 2015-09-10 | Disposition: A | Discharge status: Home / Self Care | Payer: Medicaid Other | Source: Ambulatory Visit | Attending: Family | Admitting: Family

## 2015-09-10 ENCOUNTER — Other Ambulatory Visit: Payer: Self-pay | Admitting: Family

## 2015-09-10 ENCOUNTER — Ambulatory Visit (INDEPENDENT_AMBULATORY_CARE_PROVIDER_SITE_OTHER): Payer: Self-pay | Admitting: Family

## 2015-09-10 VITALS — BP 127/72 | HR 79 | Wt 155.0 lb

## 2015-09-10 DIAGNOSIS — Z3A36 36 weeks gestation of pregnancy: Secondary | ICD-10-CM | POA: Diagnosis not present

## 2015-09-10 DIAGNOSIS — O09523 Supervision of elderly multigravida, third trimester: Secondary | ICD-10-CM

## 2015-09-10 DIAGNOSIS — O0933 Supervision of pregnancy with insufficient antenatal care, third trimester: Secondary | ICD-10-CM | POA: Diagnosis not present

## 2015-09-10 DIAGNOSIS — O24415 Gestational diabetes mellitus in pregnancy, controlled by oral hypoglycemic drugs: Secondary | ICD-10-CM

## 2015-09-10 DIAGNOSIS — O24419 Gestational diabetes mellitus in pregnancy, unspecified control: Secondary | ICD-10-CM

## 2015-09-10 MED ORDER — METFORMIN HCL 500 MG PO TABS: 500.0000 mg | ORAL_TABLET | Freq: Two times a day (BID) | ORAL | 2 refills | Status: DC

## 2015-09-10 NOTE — Progress Notes (Signed)
NST-only visit; [place], [place] not [place] glyburide due to feeling sick.  Consulted with Dr. Macon LargeAnyanwu > Metformin 500 mg BID. NST reviewed and reactive; continue scheduled antenatal testing.

## 2015-09-10 NOTE — Progress Notes (Signed)
Video interpeter [id number] Patient reports stomach pains & dizziness since starting glyburide, only took one time & hasn't taken since. Reviewed with Dr Macon LargeAnyanwu, who authorized new Rx for metformin instead of glyburide. Patient informed.

## 2015-09-11 ENCOUNTER — Encounter: Payer: Self-pay | Admitting: Family

## 2015-09-11 DIAGNOSIS — O288 Other abnormal findings on antenatal screening of mother: Secondary | ICD-10-CM | POA: Insufficient documentation

## 2015-09-14 ENCOUNTER — Ambulatory Visit (INDEPENDENT_AMBULATORY_CARE_PROVIDER_SITE_OTHER): Payer: Medicaid Other | Admitting: Obstetrics & Gynecology

## 2015-09-14 VITALS — BP 116/65 | HR 80 | Wt 153.0 lb

## 2015-09-14 DIAGNOSIS — Z789 Other specified health status: Secondary | ICD-10-CM

## 2015-09-14 DIAGNOSIS — O24419 Gestational diabetes mellitus in pregnancy, unspecified control: Secondary | ICD-10-CM

## 2015-09-14 DIAGNOSIS — O09523 Supervision of elderly multigravida, third trimester: Secondary | ICD-10-CM

## 2015-09-14 DIAGNOSIS — O289 Unspecified abnormal findings on antenatal screening of mother: Secondary | ICD-10-CM

## 2015-09-14 DIAGNOSIS — Z641 Problems related to multiparity: Secondary | ICD-10-CM

## 2015-09-14 DIAGNOSIS — O288 Other abnormal findings on antenatal screening of mother: Secondary | ICD-10-CM

## 2015-09-14 DIAGNOSIS — O0933 Supervision of pregnancy with insufficient antenatal care, third trimester: Secondary | ICD-10-CM

## 2015-09-14 DIAGNOSIS — O24415 Gestational diabetes mellitus in pregnancy, controlled by oral hypoglycemic drugs: Secondary | ICD-10-CM

## 2015-09-14 DIAGNOSIS — Z758 Other problems related to medical facilities and other health care: Secondary | ICD-10-CM

## 2015-09-14 LAB — POCT URINALYSIS DIP (DEVICE)
BILIRUBIN URINE: NEGATIVE
GLUCOSE, UA: 100 mg/dL — AB
KETONES UR: NEGATIVE mg/dL
Nitrite: NEGATIVE
Protein, ur: NEGATIVE mg/dL
Specific Gravity, Urine: 1.02 (ref 1.005–1.030)
Urobilinogen, UA: 0.2 mg/dL (ref 0.0–1.0)
pH: 5.5 (ref 5.0–8.0)

## 2015-09-14 MED ORDER — GLYBURIDE 2.5 MG PO TABS: 5.0000 mg | ORAL_TABLET | Freq: Two times a day (BID) | ORAL | 3 refills | Status: DC

## 2015-09-14 NOTE — Patient Instructions (Signed)
Gestational Diabetes Mellitus  Gestational diabetes mellitus, often simply referred to as gestational diabetes, is a type of diabetes that some women develop during pregnancy. In gestational diabetes, the pancreas does not make enough insulin (a hormone), the cells are less responsive to the insulin that is made (insulin resistance), or both. Normally, insulin moves sugars from food into the tissue cells. The tissue cells use the sugars for energy. The lack of insulin or the lack of normal response to insulin causes excess sugars to build up in the blood instead of going into the tissue cells. As a result, high blood sugar (hyperglycemia) develops. The effect of high sugar (glucose) levels can cause many problems.   RISK FACTORS  You have an increased chance of developing gestational diabetes if you have a family history of diabetes and also have one or more of the following risk factors:  · A body mass index over 30 (obesity).  · A previous pregnancy with gestational diabetes.  · An older age at the time of pregnancy.  If blood glucose levels are kept in the normal range during pregnancy, women can have a healthy pregnancy. If your blood glucose levels are not well controlled, there may be risks to you, your unborn baby (fetus), your labor and delivery, or your newborn baby.   SYMPTOMS   If symptoms are experienced, they are much like symptoms you would normally expect during pregnancy. The symptoms of gestational diabetes include:   · Increased thirst (polydipsia).  · Increased urination (polyuria).  · Increased urination during the night (nocturia).  · Weight loss. This weight loss may be rapid.  · Frequent, recurring infections.  · Tiredness (fatigue).  · Weakness.  · Vision changes, such as blurred vision.  · Fruity smell to your breath.  · Abdominal pain.  DIAGNOSIS  Diabetes is diagnosed when blood glucose levels are increased. Your blood glucose level may be checked by one or more of the following blood  tests:  · A fasting blood glucose test. You will not be allowed to eat for at least 8 hours before a blood sample is taken.  · A random blood glucose test. Your blood glucose is checked at any time of the day regardless of when you ate.  · An oral glucose tolerance test (OGTT). Your blood glucose is measured after you have not eaten (fasted) for 1-3 hours and then after you drink a glucose-containing beverage. Since the hormones that cause insulin resistance are highest at about 24-28 weeks of a pregnancy, an OGTT is usually performed during that time. If you have risk factors, you may be screened for undiagnosed type 2 diabetes at your first prenatal visit.  TREATMENT   Gestational diabetes should be managed first with diet and exercise. Medicines may be added only if they are needed.  · You will need to take diabetes medicine or insulin daily to keep blood glucose levels in the desired range.  · You will need to match insulin dosing with exercise and healthy food choices.  If you have gestational diabetes, your treatment goal is to maintain the following blood glucose levels:  · Before meals (preprandial): at or below 95 mg/dL.  · After meals (postprandial):    One hour after a meal: at or below 140 mg/dL.    Two hours after a meal: at or below 120 mg/dL.  If you have pre-existing type 1 or type 2 diabetes, your treatment goal is to maintain the following blood glucose levels:  · Before   meals, at bedtime, and overnight: 60-99 mg/dL.  · After meals: peak of 100-129 mg/dL.  HOME CARE INSTRUCTIONS   · Have your hemoglobin A1c level checked twice a year.  · Perform daily blood glucose monitoring as directed by your health care provider. It is common to perform frequent blood glucose monitoring.  · Monitor urine ketones when you are ill and as directed by your health care provider.  · Take your diabetes medicine and insulin as directed by your health care provider to maintain your blood glucose level in the desired  range.  ¨ Never run out of diabetes medicine or insulin. It is needed every day.  ¨ Adjust insulin based on your intake of carbohydrates. Carbohydrates can raise blood glucose levels but need to be included in your diet. Carbohydrates provide vitamins, minerals, and fiber which are an essential part of a healthy diet. Carbohydrates are found in fruits, vegetables, whole grains, dairy products, legumes, and foods containing added sugars.  · Eat healthy foods. Alternate 3 meals with 3 snacks.  · Maintain a healthy weight gain. The usual total expected weight gain varies according to your prepregnancy body mass index (BMI).  · Carry a medical alert card or wear your medical alert jewelry.  · Carry a 15-gram carbohydrate snack with you at all times to treat low blood glucose (hypoglycemia). Some examples of 15-gram carbohydrate snacks include:  ¨ Glucose tablets, 3 or 4.  ¨ Glucose gel, 15-gram tube.  ¨ Raisins, 2 tablespoons (24 g).  ¨ Jelly beans, 6.  ¨ Animal crackers, 8.  ¨ Fruit juice, regular soda, or low-fat milk, 4 ounces (120 mL).  ¨ Gummy treats, 9.  · Recognize hypoglycemia. Hypoglycemia during pregnancy occurs with blood glucose levels of 60 mg/dL and below. The risk for hypoglycemia increases when fasting or skipping meals, during or after intense exercise, and during sleep. Hypoglycemia symptoms can include:  ¨ Tremors or shakes.  ¨ Decreased ability to concentrate.  ¨ Sweating.  ¨ Increased heart rate.  ¨ Headache.  ¨ Dry mouth.  ¨ Hunger.  ¨ Irritability.  ¨ Anxiety.  ¨ Restless sleep.  ¨ Altered speech or coordination.  ¨ Confusion.  · Treat hypoglycemia promptly. If you are alert and able to safely swallow, follow the 15:15 rule:  ¨ Take 15-20 grams of rapid-acting glucose or carbohydrate. Rapid-acting options include glucose gel, glucose tablets, or 4 ounces (120 mL) of fruit juice, regular soda, or low-fat milk.  ¨ Check your blood glucose level 15 minutes after taking the glucose.  ¨ Take 15-20  grams more of glucose if the repeat blood glucose level is still 70 mg/dL or below.  ¨ Eat a meal or snack within 1 hour once blood glucose levels return to normal.  · Be alert to polyuria (excess urination) and polydipsia (excess thirst) which are early signs of hyperglycemia. An early awareness of hyperglycemia allows for prompt treatment. Treat hyperglycemia as directed by your health care provider.  · Engage in at least 30 minutes of physical activity a day or as directed by your health care provider. Ten minutes of physical activity timed 30 minutes after each meal is encouraged to control postprandial blood glucose levels.  · Adjust your insulin dosing and food intake as needed if you start a new exercise or sport.  · Follow your sick-day plan at any time you are unable to eat or drink as usual.  · Avoid tobacco and alcohol use.  · Keep all follow-up visits as directed   by your health care provider.  · Follow the advice of your health care provider regarding your prenatal and post-delivery (postpartum) appointments, meal planning, exercise, medicines, vitamins, blood tests, other medical tests, and physical activities.  · Perform daily skin and foot care. Examine your skin and feet daily for cuts, bruises, redness, nail problems, bleeding, blisters, or sores.  · Brush your teeth and gums at least twice a day and floss at least once a day. Follow up with your dentist regularly.  · Schedule an eye exam during the first trimester of your pregnancy or as directed by your health care provider.  · Share your diabetes management plan with your workplace or school.  · Stay up-to-date with immunizations.  · Learn to manage stress.  · Obtain ongoing diabetes education and support as needed.  · Learn about and consider breastfeeding your baby.  · You should have your blood sugar level checked 6-12 weeks after delivery. This is done with an oral glucose tolerance test (OGTT).  SEEK MEDICAL CARE IF:   · You are unable to  eat food or drink fluids for more than 6 hours.  · You have nausea and vomiting for more than 6 hours.  · You have a blood glucose level of 200 mg/dL and you have ketones in your urine.  · There is a change in mental status.  · You develop vision problems.  · You have a persistent headache.  · You have upper abdominal pain or discomfort.  · You develop an additional serious illness.  · You have diarrhea for more than 6 hours.  · You have been sick or have had a fever for a couple of days and are not getting better.  SEEK IMMEDIATE MEDICAL CARE IF:   · You have difficulty breathing.  · You no longer feel the baby moving.  · You are bleeding or have discharge from your vagina.  · You start having premature contractions or labor.  MAKE SURE YOU:  · Understand these instructions.  · Will watch your condition.  · Will get help right away if you are not doing well or get worse.     This information is not intended to replace advice given to you by your health care provider. Make sure you discuss any questions you have with your health care provider.     Document Released: 04/18/2000 Document Revised: 01/31/2014 Document Reviewed: 08/09/2011  Elsevier Interactive Patient Education ©2016 [employer]

## 2015-09-14 NOTE — Progress Notes (Signed)
Subjective:  Carolyn Huffman is a 40 y.o. [id number] at 2714w3d being seen today for ongoing prenatal care.  She is currently monitored for the following issues for this high-risk pregnancy and has Language barrier; Late prenatal care; Grand multipara; Supervision of high risk pregnancy, antepartum; Multigravida of advanced maternal age; Gestational diabetes mellitus (GDM) affecting pregnancy; and AFI (amniotic fluid index) decreased on her problem list.  Patient reports occasional contractions.  Contractions: Irregular. Vag. Bleeding: Scant.  Movement: Present. Denies leaking of fluid.   The following portions of the patient's history were reviewed and updated as appropriate: allergies, current medications, past family history, past medical history, past social history, past surgical history and problem list. Problem list updated.  Objective:   Vitals:   09/14/15 1355  BP: 116/65  Pulse: 80  Weight: 153 lb (69.4 kg)    Fetal Status: Fetal Heart Rate (bpm): NST Fundal Height: 38 cm Movement: Present     General:  Alert, oriented and cooperative. Patient is in no acute distress.  Skin: Skin is warm and dry. No rash noted.   Cardiovascular: Normal heart rate noted  Respiratory: Normal respiratory effort, no problems with respiration noted  Abdomen: Soft, gravid, appropriate for gestational age. Pain/Pressure: Present     Pelvic:  Cervical exam deferred        Extremities: Normal range of motion.  Edema: None  Mental Status: Normal mood and affect. Normal behavior. Normal judgment and thought content.   Urinalysis: Urine Protein: Trace Urine Glucose: 1+  Assessment and Plan:  Pregnancy: [id number] at 7514w3d  1. Gestational diabetes mellitus (GDM) in third trimester controlled on oral hypoglycemic drug  - Fetal nonstress test Pt currently on Metformin 500mg  bid since 09/10/2015 as she was unable to tolerate Glyburide.  Her glucose levels are still >140 for >50% of her levels.  I have increased  her to Metformin 1000mg  q am and 500 mg q pm.  She has had no adverse rx to the Metformin.   2. Multigravida of advanced maternal age, third trimester  3. Late prenatal care, third trimester  4. Language barrier [place] video interpreter used  5. Grand multipara  6. Gestational diabetes mellitus (GDM) affecting pregnancy See above  7. AFI (amniotic fluid index) decreased Has US scheduled for 8/25  Term labor symptoms and general obstetric precautions including but not limited to vaginal bleeding, contractions, leaking of fluid and fetal movement were reviewed in detail with the patient. Please refer to After Visit Summary for other counseling recommendations.  Return in about 1 week (around 09/21/2015) for Ob fu and NST.   Willodean Rosenthalarolyn Harraway-Smith, MD

## 2015-09-14 NOTE — Progress Notes (Signed)
Video interpreter Hilda LiasMarie [phone] used for encounter.  US for growth scheduled 8/25

## 2015-09-18 ENCOUNTER — Other Ambulatory Visit (HOSPITAL_COMMUNITY): Payer: Self-pay | Admitting: Maternal and Fetal Medicine

## 2015-09-18 ENCOUNTER — Ambulatory Visit (HOSPITAL_COMMUNITY)
Admission: RE | Admit: 2015-09-18 | Discharge: 2015-09-18 | Disposition: A | Discharge status: Home / Self Care | Payer: Medicaid Other | Source: Ambulatory Visit | Attending: Obstetrics & Gynecology | Admitting: Obstetrics & Gynecology

## 2015-09-18 ENCOUNTER — Encounter (HOSPITAL_COMMUNITY): Payer: Self-pay

## 2015-09-18 DIAGNOSIS — O0943 Supervision of pregnancy with grand multiparity, third trimester: Secondary | ICD-10-CM

## 2015-09-18 DIAGNOSIS — O24415 Gestational diabetes mellitus in pregnancy, controlled by oral hypoglycemic drugs: Secondary | ICD-10-CM | POA: Insufficient documentation

## 2015-09-18 DIAGNOSIS — O288 Other abnormal findings on antenatal screening of mother: Secondary | ICD-10-CM

## 2015-09-18 DIAGNOSIS — Z3A38 38 weeks gestation of pregnancy: Secondary | ICD-10-CM | POA: Insufficient documentation

## 2015-09-18 DIAGNOSIS — O09523 Supervision of elderly multigravida, third trimester: Secondary | ICD-10-CM

## 2015-09-18 DIAGNOSIS — O24419 Gestational diabetes mellitus in pregnancy, unspecified control: Secondary | ICD-10-CM

## 2015-09-18 DIAGNOSIS — O0933 Supervision of pregnancy with insufficient antenatal care, third trimester: Secondary | ICD-10-CM

## 2015-09-23 ENCOUNTER — Inpatient Hospital Stay (HOSPITAL_COMMUNITY)
Admission: AD | Admit: 2015-09-23 | Discharge: 2015-09-25 | DRG: 775 | Disposition: A | Discharge status: Home / Self Care | Payer: Medicaid Other | Source: Ambulatory Visit | Attending: Obstetrics & Gynecology | Admitting: Obstetrics & Gynecology

## 2015-09-23 ENCOUNTER — Encounter (HOSPITAL_COMMUNITY): Payer: Self-pay | Admitting: *Deleted

## 2015-09-23 DIAGNOSIS — O24425 Gestational diabetes mellitus in childbirth, controlled by oral hypoglycemic drugs: Secondary | ICD-10-CM | POA: Diagnosis present

## 2015-09-23 DIAGNOSIS — O99824 Streptococcus B carrier state complicating childbirth: Secondary | ICD-10-CM | POA: Diagnosis present

## 2015-09-23 DIAGNOSIS — Z8249 Family history of ischemic heart disease and other diseases of the circulatory system: Secondary | ICD-10-CM | POA: Diagnosis not present

## 2015-09-23 DIAGNOSIS — O4103X0 Oligohydramnios, third trimester, not applicable or unspecified: Secondary | ICD-10-CM | POA: Diagnosis present

## 2015-09-23 DIAGNOSIS — IMO0001 Reserved for inherently not codable concepts without codable children: Secondary | ICD-10-CM

## 2015-09-23 DIAGNOSIS — O24419 Gestational diabetes mellitus in pregnancy, unspecified control: Secondary | ICD-10-CM

## 2015-09-23 DIAGNOSIS — Z3A38 38 weeks gestation of pregnancy: Secondary | ICD-10-CM

## 2015-09-23 DIAGNOSIS — Z3483 Encounter for supervision of other normal pregnancy, third trimester: Secondary | ICD-10-CM | POA: Diagnosis present

## 2015-09-23 DIAGNOSIS — O288 Other abnormal findings on antenatal screening of mother: Secondary | ICD-10-CM

## 2015-09-23 HISTORY — DX: Gestational diabetes mellitus in pregnancy, unspecified control: O24.419

## 2015-09-23 LAB — CBC
HEMATOCRIT: 30.6 % — AB (ref 36.0–46.0)
Hemoglobin: 10.5 g/dL — ABNORMAL LOW (ref 12.0–15.0)
MCH: 30.6 pg (ref 26.0–34.0)
MCHC: 34.3 g/dL (ref 30.0–36.0)
MCV: 89.2 fL (ref 78.0–100.0)
PLATELETS: 167 10*3/uL (ref 150–400)
RBC: 3.43 MIL/uL — ABNORMAL LOW (ref 3.87–5.11)
RDW: 14.5 % (ref 11.5–15.5)
WBC: 6.6 10*3/uL (ref 4.0–10.5)

## 2015-09-23 LAB — GLUCOSE, CAPILLARY: GLUCOSE-CAPILLARY: 67 mg/dL (ref 65–99)

## 2015-09-23 LAB — TYPE AND SCREEN
ABO/RH(D): A POS
ANTIBODY SCREEN: NEGATIVE

## 2015-09-23 LAB — ABO/RH: ABO/RH(D): A POS

## 2015-09-23 MED ORDER — IBUPROFEN 600 MG PO TABS
600.0000 mg | ORAL_TABLET | Freq: Four times a day (QID) | ORAL | Status: DC
  Administered 2015-09-24 – 2015-09-25 (×7): 600 mg via ORAL
  Filled 2015-09-23 (×7): qty 1

## 2015-09-23 MED ORDER — ONDANSETRON HCL 4 MG/2ML IJ SOLN: 4.0000 mg | Freq: Four times a day (QID) | INTRAMUSCULAR | Status: DC | PRN

## 2015-09-23 MED ORDER — OXYCODONE-ACETAMINOPHEN 5-325 MG PO TABS: 1.0000 | ORAL_TABLET | ORAL | Status: DC | PRN

## 2015-09-23 MED ORDER — SODIUM CHLORIDE 0.9 % IV SOLN: 1.0000 g | Freq: Four times a day (QID) | INTRAVENOUS | Status: DC

## 2015-09-23 MED ORDER — OXYTOCIN BOLUS FROM INFUSION
500.0000 mL | Freq: Once | INTRAVENOUS | Status: AC
  Administered 2015-09-23: 500 mL via INTRAVENOUS

## 2015-09-23 MED ORDER — OXYTOCIN 40 UNITS IN LACTATED RINGERS INFUSION - SIMPLE MED: 2.5000 [IU]/h | INTRAVENOUS | Status: DC

## 2015-09-23 MED ORDER — LACTATED RINGERS IV SOLN: 500.0000 mL | INTRAVENOUS | Status: DC | PRN

## 2015-09-23 MED ORDER — TERBUTALINE SULFATE 1 MG/ML IJ SOLN
0.2500 mg | Freq: Once | INTRAMUSCULAR | Status: DC | PRN
  Filled 2015-09-23: qty 1

## 2015-09-23 MED ORDER — SOD CITRATE-CITRIC ACID 500-334 MG/5ML PO SOLN: 30.0000 mL | ORAL | Status: DC | PRN

## 2015-09-23 MED ORDER — SODIUM CHLORIDE 0.9 % IV SOLN
2.0000 g | Freq: Once | INTRAVENOUS | Status: AC
  Administered 2015-09-23: 2 g via INTRAVENOUS
  Filled 2015-09-23: qty 2000

## 2015-09-23 MED ORDER — OXYTOCIN 40 UNITS IN LACTATED RINGERS INFUSION - SIMPLE MED
1.0000 m[IU]/min | INTRAVENOUS | Status: DC
  Administered 2015-09-23: 2 m[IU]/min via INTRAVENOUS
  Filled 2015-09-23: qty 1000

## 2015-09-23 MED ORDER — SODIUM CHLORIDE 0.9 % IV SOLN
2.0000 g | Freq: Four times a day (QID) | INTRAVENOUS | Status: DC
  Administered 2015-09-23: 2 g via INTRAVENOUS
  Filled 2015-09-23 (×3): qty 2000

## 2015-09-23 MED ORDER — PROMETHAZINE HCL 25 MG/ML IJ SOLN
12.5000 mg | Freq: Four times a day (QID) | INTRAMUSCULAR | Status: DC | PRN
  Filled 2015-09-23: qty 1

## 2015-09-23 MED ORDER — OXYCODONE-ACETAMINOPHEN 5-325 MG PO TABS: 2.0000 | ORAL_TABLET | ORAL | Status: DC | PRN

## 2015-09-23 MED ORDER — LIDOCAINE HCL (PF) 1 % IJ SOLN
30.0000 mL | INTRAMUSCULAR | Status: DC | PRN
  Filled 2015-09-23: qty 30

## 2015-09-23 MED ORDER — BUTORPHANOL TARTRATE 2 MG/ML IJ SOLN
2.0000 mg | INTRAMUSCULAR | Status: DC | PRN
  Filled 2015-09-23: qty 2

## 2015-09-23 MED ORDER — LACTATED RINGERS IV SOLN
INTRAVENOUS | Status: DC
  Administered 2015-09-23: 17:00:00 via INTRAVENOUS

## 2015-09-23 MED ORDER — ACETAMINOPHEN 325 MG PO TABS: 650.0000 mg | ORAL_TABLET | ORAL | Status: DC | PRN

## 2015-09-23 NOTE — Progress Notes (Signed)
Patient ID: Carolyn Huffman, female   DOB: Feb 11, 1975, 40 y.o.   MRN: [id number] Pt resting comfortable, Had 2nd dose Amp for GBS SVE: 5/60/-1 AROM, clear fluid To begin pitocin augmentation Plan: NSVD  I have seen and examined this patient and I agree with the above. Cam HaiSHAW, Tiersa Dayley CNM 9:18 AM 09/24/2015

## 2015-09-23 NOTE — MAU Note (Signed)
Urine in lab 

## 2015-09-23 NOTE — MAU Note (Signed)
C/o labor since 0500 this AM;

## 2015-09-23 NOTE — H&P (Signed)
Carolyn Huffman is a 40 y.o. female presenting for contractions since 0500 hrs today. . OB History    Gravida Para Term Preterm AB Living   8 5 5  0 2 5   SAB TAB Ectopic Multiple Live Births   2 0 0 0 5     Past Medical History:  Diagnosis Date  . Gestational diabetes    Past Surgical History:  Procedure Laterality Date  . NO PAST SURGERIES     Family History: family history includes Hypertension in her father and mother. Social History:  reports that she has never smoked. She has never used smokeless tobacco. She reports that she does not drink alcohol or use drugs.     Maternal Diabetes: Yes:  Diabetes Type:  Insulin/Medication controlled Genetic Screening: Declined Maternal Ultrasounds/Referrals: Abnormal:  Findings:   Other: Oligohydramnios/borderline low normal Fetal Ultrasounds or other Referrals:  None Maternal Substance Abuse:  No Significant Maternal Medications:  Meds include: Other:  Glyburide Significant Maternal Lab Results:  Lab values include: Group B Strep positive Other Comments:  AMA  Review of Systems  Constitutional: Negative for chills and fever.  Respiratory: Negative for shortness of breath.   Cardiovascular: Negative for leg swelling.  Gastrointestinal: Positive for abdominal pain. Negative for constipation, diarrhea, nausea and vomiting.  Genitourinary: Negative for dysuria.  Neurological: Negative for dizziness.   Maternal Medical History:  Reason for admission: Contractions.  Nausea.  Contractions: Onset was 6-12 hours ago.   Frequency: regular.   Perceived severity is moderate.    Fetal activity: Perceived fetal activity is normal.   Last perceived fetal movement was within the past hour.    Prenatal complications: No bleeding, HIV, PIH or preterm labor.   Prenatal Complications - Diabetes: gestational. Diabetes is managed by oral agent (monotherapy).      Dilation: 5 Effacement (%): 60 Station: 0 Exam by:: Morrison Oldee Carter RN Blood  pressure 139/80, pulse 93, temperature 99.2 F (37.3 C), resp. rate 16, last menstrual period 12/26/2014, unknown if currently breastfeeding. Maternal Exam:  Uterine Assessment: Contraction strength is moderate.  Contraction frequency is regular.   Abdomen: Patient reports no abdominal tenderness. Fundal height is 38.   Estimated fetal weight is 6.5.   Fetal presentation: vertex  Introitus: Normal vulva. Normal vagina.  Ferning test: not done.  Nitrazine test: not done. Amniotic fluid character: not assessed.  Pelvis: adequate for delivery.   Cervix: Cervix evaluated by digital exam.     Fetal Exam Fetal Monitor Review: Mode: ultrasound.   Baseline rate: 120.  Variability: moderate (6-25 bpm).   Pattern: accelerations present and no decelerations.    Fetal State Assessment: Category I - tracings are normal.     Physical Exam  Constitutional: She is oriented to person, place, and time. She appears well-developed and well-nourished. No distress.  HENT:  Head: Normocephalic.  Cardiovascular: Normal rate, regular rhythm and normal heart sounds.   Respiratory: Effort normal. No respiratory distress. She has no wheezes. She has no rales.  GI: Soft. There is no tenderness. There is no rebound and no guarding.  Genitourinary: Vagina normal.  Genitourinary Comments: Pelvis proven to 8+2  Musculoskeletal: Normal range of motion.  Neurological: She is alert and oriented to person, place, and time.  Skin: Skin is warm and dry.  Psychiatric: She has a normal mood and affect.    Prenatal labs: ABO, Rh: A/POS/-- (07/19 1609) Antibody: NEG (07/19 1609) Rubella: 1.68 (07/19 1609) RPR: NON REAC (07/19 1609)  HBsAg: NEGATIVE (07/19  1609)  HIV: NONREACTIVE (07/19 1609)  GBS:     Assessment/Plan: [facility]UP at [redacted]w[redacted]d  Active labor with cervical change Reactive NST/Category I Gestational diabetes requiring Glyburide AMA Grand Multipara  PLAN: Admit to [facility] Routine  orders GBS prophylaxis with Ampicillin CBG  q 4 hours Anticipate SVD    [facility] 09/23/2015, 4:02 PM

## 2015-09-23 NOTE — Anesthesia Pain Management Evaluation Note (Signed)
  CRNA Pain Management Visit Note  Patient: Carolyn Huffman, 40 y.o., female  "Hello [facility]. We have an anesthesia team available at all times to provide care throughout the hospital, including epidural management and anesthesia for C-section. I don't know your plan for the delivery whether it a natural birth, water birth, IV sedation, nitrous supplementation, doula or epidural, but we want to meet your pain goals."   1.Was your pain managed to your expectations on prior hospitalizations?   Yes   2.What is your expectation for pain management during this hospitalization?     Labor support without medications  3.How can we help you reach that goal? Be available if needed  Record the patient's initial score and the patient's pain goal.   Pain: 4  Pain Goal: 8 [facility] wants you to be able to say your pain was always managed very well.  Ade Stmarie 09/23/2015

## 2015-09-24 ENCOUNTER — Encounter (HOSPITAL_COMMUNITY): Payer: Self-pay | Admitting: *Deleted

## 2015-09-24 ENCOUNTER — Other Ambulatory Visit: Payer: Self-pay | Admitting: Family Medicine

## 2015-09-24 DIAGNOSIS — O24425 Gestational diabetes mellitus in childbirth, controlled by oral hypoglycemic drugs: Secondary | ICD-10-CM

## 2015-09-24 DIAGNOSIS — Z3A38 38 weeks gestation of pregnancy: Secondary | ICD-10-CM

## 2015-09-24 DIAGNOSIS — O99824 Streptococcus B carrier state complicating childbirth: Secondary | ICD-10-CM

## 2015-09-24 LAB — RPR: RPR: NONREACTIVE

## 2015-09-24 LAB — GLUCOSE, CAPILLARY: GLUCOSE-CAPILLARY: 108 mg/dL — AB (ref 65–99)

## 2015-09-24 LAB — HIV ANTIBODY (ROUTINE TESTING W REFLEX): HIV SCREEN 4TH GENERATION: NONREACTIVE

## 2015-09-24 MED ORDER — COCONUT OIL OIL
1.0000 "application " | TOPICAL_OIL | Status: DC | PRN
  Administered 2015-09-25: 1 via TOPICAL
  Filled 2015-09-24: qty 120

## 2015-09-24 MED ORDER — ONDANSETRON HCL 4 MG PO TABS: 4.0000 mg | ORAL_TABLET | ORAL | Status: DC | PRN

## 2015-09-24 MED ORDER — WITCH HAZEL-GLYCERIN EX PADS: 1.0000 "application " | MEDICATED_PAD | CUTANEOUS | Status: DC | PRN

## 2015-09-24 MED ORDER — ACETAMINOPHEN 325 MG PO TABS
650.0000 mg | ORAL_TABLET | ORAL | Status: DC | PRN
  Administered 2015-09-24: 650 mg via ORAL
  Filled 2015-09-24: qty 2

## 2015-09-24 MED ORDER — TETANUS-DIPHTH-ACELL PERTUSSIS 5-2.5-18.5 LF-MCG/0.5 IM SUSP
0.5000 mL | Freq: Once | INTRAMUSCULAR | Status: AC
Start: 2015-09-24 — End: 2015-09-25
  Administered 2015-09-25: 0.5 mL via INTRAMUSCULAR
  Filled 2015-09-24: qty 0.5

## 2015-09-24 MED ORDER — SIMETHICONE 80 MG PO CHEW: 80.0000 mg | CHEWABLE_TABLET | ORAL | Status: DC | PRN

## 2015-09-24 MED ORDER — DIBUCAINE 1 % RE OINT: 1.0000 "application " | TOPICAL_OINTMENT | RECTAL | Status: DC | PRN

## 2015-09-24 MED ORDER — BENZOCAINE-MENTHOL 20-0.5 % EX AERO: 1.0000 "application " | INHALATION_SPRAY | CUTANEOUS | Status: DC | PRN

## 2015-09-24 MED ORDER — PRENATAL MULTIVITAMIN CH
1.0000 | ORAL_TABLET | Freq: Every day | ORAL | Status: DC
  Administered 2015-09-24 – 2015-09-25 (×2): 1 via ORAL
  Filled 2015-09-24 (×2): qty 1

## 2015-09-24 MED ORDER — DIPHENHYDRAMINE HCL 25 MG PO CAPS: 25.0000 mg | ORAL_CAPSULE | Freq: Four times a day (QID) | ORAL | Status: DC | PRN

## 2015-09-24 MED ORDER — SENNOSIDES-DOCUSATE SODIUM 8.6-50 MG PO TABS
2.0000 | ORAL_TABLET | ORAL | Status: DC
  Administered 2015-09-25: 2 via ORAL
  Filled 2015-09-24: qty 2

## 2015-09-24 MED ORDER — ZOLPIDEM TARTRATE 5 MG PO TABS: 5.0000 mg | ORAL_TABLET | Freq: Every evening | ORAL | Status: DC | PRN

## 2015-09-24 MED ORDER — ONDANSETRON HCL 4 MG/2ML IJ SOLN: 4.0000 mg | INTRAMUSCULAR | Status: DC | PRN

## 2015-09-24 NOTE — Progress Notes (Signed)
Post Partum Day 1 Subjective: up ad lib, voiding and tolerating PO  Objective: Blood pressure 123/66, pulse (!) 59, temperature 98.5 F (36.9 C), temperature source Oral, resp. rate 18, last menstrual period 12/26/2014, unknown if currently breastfeeding.  Physical Exam:  General: alert, cooperative, appears stated age and no distress Lochia: appropriate Uterine Fundus: firm Incision: n/a DVT Evaluation: No evidence of DVT seen on physical exam.   Recent Labs  09/23/15 1609  HGB 10.5*  HCT 30.6*   FBS 108  Assessment/Plan: Plan for discharge tomorrow   LOS: 1 day   [name] 09/24/2015, 7:46 AM   CNM attestation Post Partum Day #1  Carolyn Huffman is a 40 y.o. [id number] s/p SVD.  Pt denies problems with ambulating, voiding or po intake. Pain is well controlled.  Plan for birth control is IUD.  Method of Feeding: breast  PE:  BP 123/66 (BP Location: Left Arm)   Pulse (!) 59   Temp 98.5 F (36.9 C) (Oral)   Resp 18   LMP 12/26/2014 (Exact Date)   Breastfeeding? Unknown  Fundus firm  Plan for discharge: 09/25/15  Cam HaiSHAW, KIMBERLY, CNM 9:20 AM  09/24/2015

## 2015-09-25 MED ORDER — IBUPROFEN 600 MG PO TABS: 600.0000 mg | ORAL_TABLET | Freq: Four times a day (QID) | ORAL | 0 refills | Status: AC

## 2015-09-25 MED ORDER — ACETAMINOPHEN 325 MG PO TABS: 650.0000 mg | ORAL_TABLET | ORAL | 0 refills | Status: AC | PRN

## 2015-09-25 NOTE — Lactation Note (Addendum)
This note was copied from a baby's chart. Lactation Consultation Note  Video interpreter used for [place]. Baby 37 hours old.  P6.  Breastfed other children for approx 3 months per mother. Discussed basics.  Mother denies problems or concerns. Mom encouraged to feed baby 8-12 times/24 hours and with feeding cues.  Reviewed engorgement care and monitoring voids/stools. Mom made aware of O/P services, breastfeeding support groups, community resources, and our phone # for post-discharge questions.   Baby latched upon entering. Encouraged depth and massage during feeding. Suggest discuss short lingual frenulum with Peds MD.    Patient Name: Carolyn Huffman Today's Date: 09/25/2015     Maternal Data    Feeding Feeding Type: Breast Fed  LATCH Score/Interventions Latch: Grasps breast easily, tongue down, lips flanged, rhythmical sucking. Intervention(s): Adjust position;Assist with latch  Audible Swallowing: Spontaneous and intermittent  Type of Nipple: Everted at rest and after stimulation  Comfort (Breast/Nipple): Filling, red/small blisters or bruises, mild/mod discomfort  Problem noted: Mild/Moderate discomfort Interventions (Mild/moderate discomfort): Hand massage;Hand expression (Coconut oil given with instructions for sore nipples.)  Hold (Positioning): No assistance needed to correctly position infant at breast.  LATCH Score: 9  Lactation Tools Discussed/Used     Consult Status      Dahlia ByesBerkelhammer, [facility] 09/25/2015, 12:22 PM

## 2015-09-25 NOTE — Progress Notes (Signed)
Post Partum Day 2 Subjective: no complaints, up ad lib, voiding, tolerating PO and + flatus. BM since delivery. Pain is well-controlled.   Objective: Blood pressure 116/77, pulse 70, temperature 98.7 F (37.1 C), temperature source Oral, resp. rate 18, last menstrual period 12/26/2014, not currently breastfeeding.  Physical Exam:  General: alert, cooperative and no distress Lochia: appropriate Uterine Fundus: firm DVT Evaluation: No evidence of DVT seen on physical exam.   Recent Labs  09/23/15 1609  HGB 10.5*  HCT 30.6*    Assessment/Plan: Discharge home, Breastfeeding and Contraception Mirena IUD.   LOS: 2 days   Ambrose FinlandGabriela E Kayal Mula 09/25/2015, 7:34 AM

## 2015-09-25 NOTE — Progress Notes (Signed)
Assisted patient with latching baby to breast. Patient states her nipples are sore. No redness or breaks in the skin to nipples or areola. Pt encouraged to express her colostrum and massage into nipples after feedings and reinforced deeper latch and proper positioning of baby at the breast. Coconut oil given with instructions on use. Lucy ChrisJaime Sharday Michl, RN

## 2015-09-25 NOTE — Discharge Summary (Signed)
OB Discharge Summary     Patient Name: Carolyn Huffman DOB: Jan 20, 1976 MRN: [id number]  Date of admission: 09/23/2015 Delivering MD: Lynda RainwaterIETTE, STACIE ANN   Date of discharge: 09/25/2015  Admitting diagnosis: 38wks, pain Intrauterine pregnancy: 901w5d     Secondary diagnosis:  Active Problems:   Active labor A2GDM     Discharge diagnosis: Term Pregnancy Delivered and GDM A2                                                                                                Post partum procedures:none  Augmentation: AROM and Pitocin  Complications: None  Hospital course:  Onset of Labor With Vaginal Delivery     40 y.o. yo [id number] at 411w5d was admitted in Active Labor on 09/23/2015. Patient had an uncomplicated labor course as follows:  Membrane Rupture Time/Date: 9:06 PM ,09/23/2015   Intrapartum Procedures: Episiotomy: None [1]                                         Lacerations:  None [1]  Patient had a delivery of a Viable infant. 09/23/2015  Information for the patient's newborn:  Carolyn Huffman, Girl Brandy [[id number]][[id number]]  Delivery Method: Vaginal, Spontaneous Delivery (Filed from Delivery Summary)   Pt does have A2GDM. Pt had fasting sugar 108. Will D/C metformin on discharge. Follow up in 6 wks for 2 hour Glucola.    Pateint had an uncomplicated postpartum course.  She is ambulating, tolerating a regular diet, passing flatus, and urinating well. Patient is discharged home in stable condition on 09/25/15.    Physical exam Vitals:   09/24/15 0216 09/24/15 0615 09/24/15 1747 09/25/15 0620  BP: 120/72 123/66 116/77 (!) 86/64  Pulse: 64 (!) 59 70 61  Resp: 18 18 18 18   Temp: 98.6 F (37 C) 98.5 F (36.9 C) 98.7 F (37.1 C) 98.1 F (36.7 C)  TempSrc: Oral Oral Oral Oral   General: alert, cooperative and no distress Lochia: appropriate Uterine Fundus: firm Incision: N/A DVT Evaluation: No evidence of DVT seen on physical exam. No significant calf/ankle edema. Labs: Lab  Results  Component Value Date   WBC 6.6 09/23/2015   HGB 10.5 (L) 09/23/2015   HCT 30.6 (L) 09/23/2015   MCV 89.2 09/23/2015   PLT 167 09/23/2015   No flowsheet data found.  Discharge instruction: per After Visit Summary and "Baby and Me Booklet".  After visit meds:    Medication List    STOP taking these medications   metFORMIN 500 MG tablet Commonly known as:  GLUCOPHAGE     TAKE these medications   acetaminophen 325 MG tablet Commonly known as:  TYLENOL Take 2 tablets (650 mg total) by mouth every 4 (four) hours as needed (for pain scale < 4).   ibuprofen 600 MG tablet Commonly known as:  ADVIL,MOTRIN Take 1 tablet (600 mg total) by mouth every 6 (six) hours.   PRENATAL VITAMINS PLUS 27-1 MG Tabs Take 1 tablet by mouth daily.  Diet: carb modified diet  Activity: Advance as tolerated. Pelvic rest for 6 weeks.   Outpatient follow up:6 weeks Follow up Appt:No future appointments. Follow up Visit:No Follow-up on file.  Postpartum contraception: IUD Mirena  Newborn Data: Live born female  Birth Weight: 6 lb 3.3 oz (2815 g) APGAR: 9, 9  Baby Feeding: Breast Disposition:home with mother   09/25/2015 Carolyn Penna, MD

## 2015-09-25 NOTE — Discharge Instructions (Signed)

## 2015-09-26 ENCOUNTER — Encounter: Payer: Self-pay | Admitting: *Deleted

## 2015-10-28 ENCOUNTER — Ambulatory Visit: Payer: Self-pay | Admitting: Obstetrics and Gynecology

## 2015-10-28 ENCOUNTER — Encounter: Payer: Self-pay | Admitting: Obstetrics and Gynecology

## 2015-10-28 NOTE — Progress Notes (Signed)
Patient did not keep postpartum appointment for 10/28/2015.  Carolyn Huffman, Jr MD Attending Center [facility] Midwife(Faculty Practice)
